# Patient Record
Sex: Female | Born: 1995 | Race: Black or African American | Hispanic: No
Health system: Midwestern US, Community
[De-identification: ages and names within clinical notes are randomized; demographics above are authoritative.]

---
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
## 2018-08-11 NOTE — ED Notes (Signed)
ED Triage Note       ED Secondary Triage Entered On:  08/11/2018 21:07 EDT    Performed On:  08/11/2018 21:06 EDT by Willeen Cass, RN, Rhys Martini               General Information   Barriers to Learning :   None evident   ED Home Meds Section :   Document assessment   [facility] ED Fall Risk Section :   Document assessment   ED Advance Directives Section :   Document assessment   Royston Bake - 08/11/2018 21:06 EDT   (As Of: 08/11/2018 21:07:12 EDT)   Problems(Active)    Asthma (SNOMED CT  :215872761 )  Name of Problem:   Asthma ; Recorder:   Marcellina Millin; Confirmation:   Confirmed ; Classification:   Patient Stated ; Code:   [id number] ; Contributor System:   Dietitian ; Last Updated:   08/11/2018 20:34 EDT ; Life Cycle Date:   08/11/2018 ; Life Cycle Status:   Active ; Vocabulary:   SNOMED CT          Diagnoses(Active)    Shortness of breath  Date:   08/11/2018 ; Diagnosis Type:   Reason For Visit ; Confirmation:   Complaint of ; Clinical Dx:   Shortness of breath ; Classification:   Medical ; Clinical Service:   Emergency medicine ; Code:   PNED ; Probability:   0 ; Diagnosis Code:   [id number]             -    Procedure History   (As Of: 08/11/2018 21:07:12 EDT)     Phoebe Perch Fall Risk Assessment Tool   Hx of falling last 3 months ED Fall :   No   Patient confused or disoriented ED Fall :   No   Patient intoxicated or sedated ED Fall :   No   Patient impaired gait ED Fall :   No   Use a mobility assistance device ED Fall :   No   Patient altered elimination ED Fall :   No   [facility] ED Fall Score :   0    Willeen Cass RN, Evette Georges F - 08/11/2018 21:06 EDT   ED Advance Directive   Advance Directive :   No   Willeen Cass RN, Evette Georges F - 08/11/2018 21:06 EDT

## 2018-08-11 NOTE — ED Notes (Signed)
ED Notes               called talked to pt. informed of corona virus test negative. pt still coughing, no new symptoms. reviewed continued cautious observation at home and safety standards per cdc guidelines.   [employer]

## 2018-08-11 NOTE — Discharge Summary (Signed)
 ED Clinical Summary                     [facility]  [address]  [place], [place] [address]  [phone]          PERSON INFORMATION  Name: MALVA, DIESING Age:  23 Years DOB: Aug 15, 1995   Sex: Female Language: English PCP: PCP,  NONE   Marital Status: Single Phone: [phone] Med Service: MED-Medicine   MRN: [id number] Acct# [id number] Arrival: 08/11/2018 20:30:00   Visit Reason: Shortness of breath; FEVER,TROUBLE BREATHING Acuity: 4 LOS: 000 02:45   Address:    [address] [place] [address]   Diagnosis:    Left lower lobe pneumonia  Medications:          New Medications  Printed Prescriptions  benzonatate (Tessalon Perles 100 mg oral capsule) 1 Tabs Oral (given by mouth) 3 times a day as needed cough for 7 Days. Refills: 0.  Last Dose:____________________  Medications that have not changed  Other Medications  albuterol (albuterol CFC free 90 mcg/inh inhalation aerosol)   Last Dose:____________________  budesonide-formoterol (Symbicort 160 mcg-4.5 mcg/inh inhalation aerosol)   Last Dose:____________________  cetirizine (ZyrTEC) 10 Milligram Oral (given by mouth) every day as needed for allergy symptoms.  Last Dose:____________________  dicyclomine (Bentyl 10 mg oral capsule) 1 Capsules Oral (given by mouth) 4 times a day for 3 Days. Refills: 0.,  THIS MEDICATION IS ASSOCIATED  WITH  AN INCREASED RISK OF FALLS.  Last Dose:____________________      Medications Administered During Visit:                Medication Dose Route   iopamidol 100 mL IV Contrast   azithromycin 500 mg Oral               Allergies      No Known Medication Allergies      Major Tests and Procedures:  The following procedures and tests were performed during your ED visit.  COMMON PROCEDURES%>  COMMON PROCEDURES COMMENTS%>                PROVIDER INFORMATION               Provider Role Assigned [place], [place] [facility] ED MidLevel 08/11/2018 20:31:40    Blair, RN, Siri FALCON ED  Nurse 08/11/2018 20:55:10    Newton Proffer ED Nurse 08/11/2018 21:11:56        Attending Physician:  DALU-MD,  DAVID      Admit Doc  DALU-MD,  DAVID     Consulting Doc       VITALS INFORMATION  Vital Sign Triage Latest   Temp Oral ORAL_1%> ORAL%>   Temp Temporal TEMPORAL_1%> TEMPORAL%>   Temp Intravascular INTRAVASCULAR_1%> INTRAVASCULAR%>   Temp Axillary AXILLARY_1%> AXILLARY%>   Temp Rectal RECTAL_1%> RECTAL%>   02 Sat 96 % 97 %   Respiratory Rate RATE_1%> RATE%>   Peripheral Pulse Rate PULSE RATE_1%>121 bpm PULSE RATE%>   Apical Heart Rate HEART RATE_1%> HEART RATE%>   Blood Pressure BLOOD PRESSURE_1%>/ BLOOD PRESSURE_1%>83 mmHg BLOOD PRESSURE%> / BLOOD PRESSURE%>74 mmHg                 Immunizations      No Immunizations Documented This Visit          DISCHARGE INFORMATION   Discharge Disposition: H Outpt-Sent Home   Discharge Location:  Home   Discharge Date and Time:  08/11/2018 23:15:00   ED Checkout Date and Time:  08/11/2018 23:15:00     DEPART REASON INCOMPLETE INFORMATION               Depart Action Incomplete Reason   Interactive View/I&O Recently assessed               Problems      No Problems Documented              Smoking Status      Never smoker         PATIENT EDUCATION INFORMATION  Instructions:     Community-Acquired Pneumonia, Adult     Follow up:                   With: Address: When:   Follow up with primary care provider  Within 1 week   Comments:   Please follow-up with your primary care provider. If you do not have one, you can call [phone] [facility] and they can help you set up an appointment.   Please quarantine at home until you get the report back on your covid 19 viral testing. You will be called with results.   In the meantime, take full course of antibiotics as prescribed starting tomorrow.   You may take cough medication as prescribed as needed for symptoms.   Continue your home inhalers as needed for wheezing or shortness of breath.   Alternate Tylenol and ibuprofen as needed for fevers.    Return to the ER for any worsening or new concerning symptoms as discussed.              ED PROVIDER DOCUMENTATION     Patient:   KIANI, WURTZEL             MRN: [id number]            FIN: [id number]               Age:   60 years     Sex:  Female     DOB:  Jul 13, 1995   Associated Diagnoses:   Left lower lobe pneumonia   Author:   VINETTA CAMIE HAMMERSMITH      Basic Information   Time seen: Provider Seen (ST)   ED Provider/Time:    VINETTA CAMIE LANE / 08/11/2018 20:31  .   Additional information: Chief Complaint from Nursing Triage Note   Chief Complaint  Chief Complaint: 3 days of feeling sob and lightheaded has hx of asthma (08/11/18 20:32:00).      History of Present Illness   Patient is a 23 year old female who presents to the emergency department for evaluation of multiple complaints.  She states that for the past 3 days, she has had dry cough, shortness of breath, tightness in her chest, intermittent lightheadedness, and fevers.  She reports T-max of 102 earlier today, improved with TheraFlu this evening around 7 PM.  She denies any syncope, nasal congestion, sore throat or ear pain, hemoptysis, abdominal pain, nausea/vomiting, diarrhea, urinary complaints, rashes, lower extremity edema or calf tenderness.  She denies any known ill contacts or recent travel.  She is a non-smoker.  She does have a history of asthma for which she uses an inhaler, and states that she has been using this at home with improvement in wheezing, but continues to have other symptoms as above..        Review of Systems   Constitutional symptoms:  Fever, no chills, no fatigue.  Skin symptoms:  No rash,    Eye symptoms:  No blurred vision,    ENMT symptoms:  No ear pain, no sore throat, no nasal congestion.    Respiratory symptoms:  Shortness of breath, cough, wheezing, no hemoptysis, no sputum production.    Cardiovascular symptoms:  Chest pain, no palpitations, no syncope, no diaphoresis, no peripheral edema.    Gastrointestinal  symptoms:  No abdominal pain, no nausea, no vomiting, no diarrhea.    Genitourinary symptoms:  No dysuria,    Musculoskeletal symptoms:  No back pain,    Neurologic symptoms:  Dizziness, no headache, no altered level of consciousness, no numbness, no tingling, no focal weakness.    Allergy/immunologic symptoms:  No recurrent infections, no impaired immunity.       Health Status   Allergies:    Allergic Reactions (All)  No Known Medication Allergies.   Medications:  (Selected)   Prescriptions  Prescribed  Bentyl 10 mg oral capsule: 10 mg, 1 caps, Oral, QID, for 3 days, 12 caps, 0 Refill(s)  Documented Medications  Documented  Symbicort 160 mcg-4.5 mcg/inh inhalation aerosol: 0 Refill(s)  ZyrTEC: 10 mg, Oral, Daily, PRN: allergy symptoms, 0 Refill(s)  albuterol CFC free 90 mcg/inh inhalation aerosol: 0 Refill(s).      Past Medical/ Family/ Social History   Medical history: Reviewed as documented in chart.   Surgical history: Reviewed as documented in chart.   Family history: Not significant.   Social history: Reviewed as documented in chart.   Problem list:    Active Problems (1)  Asthma   , per nurse's notes.      Physical Examination               Vital Signs   Vital Signs   08/11/2018 20:32 EDT Systolic Blood Pressure 121 mmHg    Diastolic Blood Pressure 83 mmHg    Temperature Oral 37 degC    Heart Rate Monitored 109 bpm  HI    Respiratory Rate 17 br/min    SpO2 96 %    Measurements   08/11/2018 20:35 EDT Body Mass Index est meas 20.28 kg/m2    Body Mass Index Measured 20.28 kg/m2   08/11/2018 20:32 EDT Height/Length Measured 157 cm    Weight Dosing 50 kg    General:  Alert, no acute distress.    Skin:  Warm, dry.    Head:  Normocephalic, atraumatic.    Neck:  Supple, trachea midline, no tenderness.    Eye:  Extraocular movements are intact, normal conjunctiva.    Cardiovascular:  Normal peripheral perfusion, No edema, Regular rhythm, tachycardic.    Respiratory:  Lungs are clear to auscultation, respirations are  non-labored, breath sounds are equal, Symmetrical chest wall expansion.    Chest wall:  No tenderness.   Gastrointestinal:  Soft, Nontender.    Neurological:  Alert and oriented to person, place, time, and situation, No focal neurological deficit observed.    Psychiatric:  Cooperative, appropriate mood & affect, normal judgment.       Medical Decision Making   Differential Diagnosis::  Bronchitis, upper respiratory infection, pneumonia, pulmonary embolism, influenza.    Rationale:  PA/NP reviewed with co-signing physician: ECG, lab results, radiology studies, medication, diagnosis, and plan of care.   Documents reviewed:  Emergency department nurses' notes.   Electrocardiogram:  Emergency Provider interpretation performed by me, No ST-T changes, Sinus tachycardia.    Results review:  Lab results : Lab View   08/11/2018 21:14 EDT Estimated Creatinine  Clearance 116.29 mL/min   08/11/2018 20:59 EDT WBC 7.4 x10e3/mcL    RBC 4.27 x10e6/mcL    Hgb 12.8 g/dL    HCT 61.3 %    MCV 09.5 fL    MCH 30.0 pg    MCHC 33.2 g/dL    RDW 88.1 %    Platelet 224 x10e3/mcL    MPV 9.8 fL    Neutro Auto 57.1 %    Neutro Absolute 4.3 x10e3/mcL    Immature Grans Percent 0.1 %  NA    Immature Grans Absolute 0.0 x10e3/mcL  NA    Lymph Auto 27.0 %    Lymph Absolute 2.0 x10e3/mcL    Mono Auto 15.1 %  HI    Mono Absolute 1.1 x10e3/mcL  HI    Eosinophil Percent 0.4 %    Eos Absolute 0.0 x10e3/mcL    Basophil Auto 0.3 %    Baso Absolute 0.0 x10e3/mcL    D Dimer 0.64 mcg/mL FEU  HI    Sodium Lvl 134 mmol/L  LOW    Potassium Lvl 3.5 mmol/L    Chloride 96 mmol/L  LOW    CO2 25 mmol/L    Glucose Random 112 mg/dL  HI    BUN 11 mg/dL    Creatinine Lvl 0.6 mg/dL    AGAP 13 mmol/L    Osmolality Calc 268 mOsm/kg  LOW    Calcium Lvl 9.1 mg/dL    eGFR AA 848 fO/fpw/8.26f    eGFR Non-AA 130 mL/min/1.26m    Radiology results:  Rad Results (ST)   CT Angiography Chest w/ + w/o Contrast  ?  08/11/18 21:54:43  CTA CHEST: 08/11/18    INDICATION: PE suspected, high  pretest prob    TECHNIQUE: Images were obtained from apices through adrenals with IV contrast  without adverse reaction, with volumetric 3-D imaging also performed. CT  scanning was performed using radiation dose reduction techniques when  appropriate, per system protocol.      COMPARISON: None    FINDINGS: Pulmonary arteries and major branches show no filling defect, and no  pulmonary embolus is appreciated. No sign of aortic aneurysm or aortic  dissection is seen. No enlargement of main pulmonary artery.    Patchy moderate consolidation is present in the medial aspect of the left lower  lobe along the mediastinum particularly. Some air bronchograms are seen as well.  No cavitation. No sign of pulmonary density elsewhere. Trace right pleural fluid  with little if any left pleural fluid appreciated.    No thyroid nodule. 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  Some small nodes could be present in the left hilum. No sign of adenopathy  elsewhere. No cardiomegaly or pericardial effusion.    Upper abdomen is unremarkable. No bone lesion appreciated.      IMPRESSION:    No pulmonary embolus identified.    Moderate patchy consolidation in the left lower lobe most resembling pneumonia.  Depending on clinical information follow-up PA and lateral views of the chest  could be obtained to ensure resolution.  ?  Signed By: SALLY LYNWOOD MICKEY SABRA   Notes:  23 year old female who has had cough, fever, shortness of breath over the past few days.  She is resting comfortably currently, no acute distress.  Hemodynamically stable and nontoxic in appearance.  Her respirations are nonlabored and her lungs are clear to auscultation bilaterally.  Her oxygen saturation is 96% on room air.  She is tachycardic in the 1 teens at initial presentation,  but heart sounds are regular and she has good peripheral perfusion.  Her ENT exam is unremarkable.  She has no lower extremity edema or calf tenderness.  EKG shows  tachycardia without any evidence of ischemia today.  Labs including CBC, BMP, dimer are reviewed.  Her d-dimer is slightly elevated today, and given her chest tightness, shortness of breath, and tachycardia, a CT scan of her chest was obtained to rule out pulmonary embolus, though she does not have any risk factors for this.  Thankfully, no evidence of thrombosis, but she does have evidence of a left lower lobe pneumonia.  She has a normal white blood cell count.  We will start her on antibiotics for presumed community-acquired pneumonia.  Given current covid 19 viral pandemic, I have also obtained nasopharyngeal swab for this and she will be notified of any positive results.  Counseled on quarantine at home in the meantime.  We reviewed signs and symptoms which return to the emergency department, otherwise we will have her follow-up with primary care.  She voices understanding and is stable for discharge.  At the time of discharge, her heart rate is now in the 90s and she continues to have oxygen saturations in the upper 90s with nonlabored respirations.      Impression and Plan   Diagnosis   Left lower lobe pneumonia (ICD10-CM J18.9, Discharge, Medical)   Plan   Condition: Stable.    Disposition: Discharged: to home.    Prescriptions: Launch prescriptions   Pharmacy:  Tessalon Perles 100 mg oral capsule (Prescribe): 1 tabs, Oral, TID, for 7 days, PRN: cough, 21 caps, 0 Refill(s)  Zithromax 250 mg oral tablet (Prescribe): 1,000 mg, 4 tabs, Oral, Once, 4 tabs, 0 Refill(s).    Patient was given the following educational materials: Community-Acquired Pneumonia, Adult.    Follow up with: Follow up with primary care provider Within 1 week Please follow-up with your primary care provider.  If you do not have one, you can call [phone] [facility] and they can help you set up an appointment.  Please quarantine at home until you get the report back on your covid 19 viral testing.  You will be called with results.  In the meantime,  take full course of antibiotics as prescribed starting tomorrow.  You may take cough medication as prescribed as needed for symptoms.  Continue your home inhalers as needed for wheezing or shortness of breath.  Alternate Tylenol and ibuprofen as needed for fevers.  Return to the ER for any worsening or new concerning symptoms as discussed..    Counseled: Patient, Regarding diagnosis, Regarding diagnostic results, Regarding treatment plan, Regarding prescription, Patient indicated understanding of instructions.    Addendum by EUGENE LENIS on Aug 11, 2018 23:06 EDT           The patient's history, exam findings, diagnostics, and a summary of any interventions or procedures was reviewed in detail with PA.

## 2018-08-11 NOTE — ED Notes (Signed)
 ED Patient Summary       ;       [facility]  [address], [place], [place] [address]  [phone]  Discharge Instructions (Patient)  _______________________________________     Name: Caroline Moreno, Caroline Moreno  DOB: [birth year]                   MRN: [id number]                   FIN: [id number]  Reason For Visit: Shortness of breath; FEVER,TROUBLE BREATHING  Final Diagnosis: Left lower lobe pneumonia     Visit Date: 08/11/2018 20:30:00  Address: [address] [place] [address]  Phone: [phone]     Primary Care Provider:      Name: PCP,  NONE      Phone:         Emergency Department Providers:        Primary Physician:            [facility] would like to thank you for allowing us  to assist you with your healthcare needs. The following includes patient education materials and information regarding your injury/illness.     Follow-up Instructions: You were treated today on an emergency basis, it may be wise to contact your primary care provider to notify them of your visit today. You may have been referred to your regular doctor or a specialist, please follow up as instructed. If your condition worsens or you can't get in to see the doctor, contact the Emergency Department.              With: Address: When:   Follow up with primary care provider  Within 1 week   Comments:   Please follow-up with your primary care provider. If you do not have one, you can call [phone] DOCS and they can help you set up an appointment.   Please quarantine at home until you get the report back on your covid 19 viral testing. You will be called with results.   In the meantime, take full course of antibiotics as prescribed starting tomorrow.   You may take cough medication as prescribed as needed for symptoms.   Continue your home inhalers as needed for wheezing or shortness of breath.   Alternate Tylenol and ibuprofen as needed for fevers.   Return to the ER for any worsening or new  concerning symptoms as discussed.              Printed Prescriptions:    Patient Education Materials:  Discharge Orders          Discharge Patient 08/11/18 22:13:00 EDT  Discharge Special Instructions *You will be notified of your test results by phone (positive or negative), but it may take up to 5 days to get these back. Please make sure that we have your correct phone number before you leave the Emergency Department         Comment:      Community-Acquired Pneumonia, Adult     Community-Acquired Pneumonia, Adult    Pneumonia is an infection of the lungs. There are different types of pneumonia. One type can develop while a person is in a hospital. A different type, called community-acquired pneumonia, develops in people who are not, or have not recently been, in the hospital or other health care facility.       CAUSES    Pneumonia may be caused by bacteria, viruses,  or funguses. Community-acquired pneumonia is often caused by Streptococcus pneumonia bacteria. These bacteria are often passed from one person to another by breathing in droplets from the cough or sneeze of an infected person.    RISK FACTORS    The condition is more likely to develop in:     People who have?chronic diseases, such as chronic obstructive pulmonary disease (COPD), asthma, congestive heart failure, cystic fibrosis, diabetes, or kidney disease.     People who have?early-stage or late-stage HIV.     People who have?sickle cell disease.     People who have?had their spleen removed (splenectomy).     People who have?poor dental hygiene.     People who have?medical conditions that increase the risk of breathing in (aspirating) secretions their own mouth and nose. ?     People who have?a weakened immune system (immunocompromised).     People who smoke.     People who?travel to areas where pneumonia-causing germs commonly exist.     People who?are around animal habitats or animals that have pneumonia-causing germs, including birds, bats,  rabbits, cats, and farm animals.    SYMPTOMS    Symptoms of this condition include:     A?dry cough.     A wet (productive) cough.     Fever.     Sweating.     Chest pain, especially when breathing deeply or coughing.     Rapid breathing or difficulty breathing.     Shortness of breath.     Shaking chills.     Fatigue.     Muscle aches.    DIAGNOSIS    Your health care provider will take a medical history and perform a physical exam. You may also have other tests, including:     Imaging studies of your chest, including X-rays.     Tests to check your blood oxygen level and other blood gases.     Other tests on blood, mucus (sputum), fluid around your lungs (pleural fluid), and urine.    If your pneumonia is severe, other tests may be done to identify the specific cause of your illness.    TREATMENT    The type of treatment that you receive depends on many factors, such as the cause of your pneumonia, the medicines you take, and other medical conditions that you have. For most adults, treatment and recovery from pneumonia may occur at home. In some cases, treatment must happen in a hospital. Treatment may include:     Antibiotic medicines, if the pneumonia was caused by bacteria.     Antiviral medicines, if the pneumonia was caused by a virus.     Medicines that are given by mouth or through an IV tube.     Oxygen.     Respiratory therapy.    Although rare, treating severe pneumonia may include:     Mechanical ventilation. This is done if you are not breathing well on your own and you cannot maintain a safe blood oxygen level.     Thoracentesis. This procedure?removes fluid around one lung or both lungs to help you breathe better.    HOME CARE INSTRUCTIONS     Take over-the-counter and prescription medicines only as told by your health care provider.    ? Only take?cough medicine if you are losing sleep. Understand that cough medicine can prevent your body's natural ability to remove mucus from your lungs.    ? If  you were prescribed an antibiotic medicine, take it  as told by your health care provider. Do not stop taking the antibiotic even if you start to feel better.     Sleep in a semi-upright position at night. Try sleeping in a reclining chair, or place a few pillows under your head.     Do not use tobacco products, including cigarettes, chewing tobacco, and e-cigarettes. If you need help quitting, ask your health care provider.     Drink enough water to keep your urine clear or pale yellow. This will help to thin out mucus secretions in your lungs.    PREVENTION    There are ways that you can decrease your risk of developing community-acquired pneumonia. Consider getting a pneumococcal vaccine if:     You are older than 21 years of age.     You are older than 21 years of age and are undergoing cancer treatment, have chronic lung disease, or have other medical conditions that affect your immune system. Ask your health care provider if this applies to you.    There are different types and schedules of pneumococcal vaccines. Ask your health care provider which vaccination option is best for you.    You may also prevent community-acquired pneumonia if you take these actions:     Get an influenza vaccine every year. Ask your health care provider which type of influenza vaccine is best for you.     Go to the dentist on a regular basis.     Wash your hands often. Use hand sanitizer if soap and water are not available.    SEEK MEDICAL CARE IF:     You have a fever.     You are losing sleep because you cannot control your cough with cough medicine.    SEEK IMMEDIATE MEDICAL CARE IF:     You have worsening shortness of breath.     You have increased chest pain.     Your sickness becomes worse, especially if you are an older adult or have a weakened immune system.     You cough up blood.    This information is not intended to replace advice given to you by your health care provider. Make sure you discuss any questions you have  with your health care provider.    Document Released: 03/25/2005 Document Revised: 12/14/2014 Document Reviewed: 07/20/2014  Elsevier Interactive Patient Education ?2016 Elsevier Inc[employer]         Allergy Info: No Known Medication Allergies     Medication Information:  [facility] provided you with a complete list of medications post discharge, if you have been instructed to stop taking a medication please ensure you also follow up with this information to your Primary Care Physician.  Unless otherwise noted, patient will continue to take medications as prescribed prior to the Emergency Room visit.  Any specific questions regarding your chronic medications and dosages should be discussed with your physician(s) and pharmacist.          albuterol (albuterol CFC free 90 mcg/inh inhalation aerosol)  benzonatate (Tessalon Perles 100 mg oral capsule) 1 Tabs Oral (given by mouth) 3 times a day as needed cough for 7 Days. Refills: 0.  budesonide-formoterol (Symbicort 160 mcg-4.5 mcg/inh inhalation aerosol)  cetirizine (ZyrTEC) 10 Milligram Oral (given by mouth) every day as needed for allergy symptoms.  dicyclomine (Bentyl 10 mg oral capsule) 1 Capsules Oral (given by mouth) 4 times a day for 3 Days. Refills: 0.,  THIS MEDICATION IS ASSOCIATED  WITH  AN INCREASED RISK OF FALLS.      Medications Administered During Visit:              Medication Dose Route   iopamidol 100 mL IV Contrast   azithromycin 500 mg Oral          Major Tests and Procedures:  The following procedures and tests were performed during your Emergency Room visit.  COMMON PROCEDURES%>  COMMON PROCEDURES COMMENTS%>          Laboratory Orders  Name Status Details   .CoVID-19, NAA (R) Ordered Nasopharyngeal Swab, Stat, ST - Stat, 08/11/18 20:44:00 EDT, 08/11/18 20:44:00 EDT, Nurse collect, KIMSEY-PA-C,  [address], Print label Y/N   BMP Completed Blood, Stat, ST - Stat, 08/11/18 20:42:00 EDT, 08/11/18 20:42:00 EDT, Nurse collect,  KIMSEY-PA-C,  [address], Print label Y/N   CBCDIFF Completed Blood, Stat, ST - Stat, 08/11/18 20:42:00 EDT, 08/11/18 20:42:00 EDT, Nurse collect, VINETTA,  [address], Print label Y/N   D Dimer Completed Blood, Stat, ST - Stat, 08/11/18 20:42:00 EDT, 08/11/18 20:42:00 EDT, Nurse collect, VINETTA,  [address], Print label Y/N               Radiology Orders  Name Status Details   CT Angiography Chest w/ + w/o Contrast Completed 08/11/18 21:17:00 EDT, STAT 1 hour or less, Reason: PE suspected, high pretest prob, Transport Mode: STRETCHER, Rad Type, pp_set_radiology_subspecialty               Patient Care Orders  Name Status Details   COVID-19 Status Ordered 08/11/18 20:44:21 EDT, NOT VALID FOR pharmacy, laboratory, radiology., 08/11/18 20:44:21 EDT, COVID-19 PUI - under investigation   Communication to Nursing Ordered 08/11/18 20:44:00 EDT, NOT VALID FOR pharmacy, laboratory, radiology., 08/11/18 20:44:00 EDT, 08/11/18 20:44:00 EDT   Discharge Patient Ordered 08/11/18 22:13:00 EDT   Discharge Special Instructions Ordered *You will be notified of your test results by phone (positive or negative), but it may take up to 5 days to get these back. Please make sure that we have your correct phone number before you leave the Emergency Department   ED Assessment Adult Completed 08/11/18 20:35:54 EDT, 08/11/18 20:35:54 EDT   ED Secondary Triage Completed 08/11/18 20:35:54 EDT, 08/11/18 20:35:54 EDT   ED Triage Adult Completed 08/11/18 20:30:07 EDT, 08/11/18 20:30:07 EDT   Notify Provider Ordered 08/11/18 20:44:21 EDT, This message can only be seen by Nursing, it is not visible to Pharmacy, Laboratory, or Radiology., 08/11/18 20:44:21 EDT   Patient Isolation Ordered 08/11/18 20:44:00 EDT, Contact and Airborne, Constant Indicator   Saline Lock Insert Completed 08/11/18 21:03:00 EDT, Once, 08/11/18 21:03:00 EDT        ---------------------------------------------------------------------------------------------------------------------  [facility] allows you to manage your health, view your test results, and retrieve your discharge documents from your hospital stay securely and conveniently from your computer.     To begin the enrollment process, visit [email or web address] Click on "Sign up now" under [facility].   Comment:

## 2018-08-11 NOTE — ED Provider Notes (Signed)
Cough *ED        Patient:   Caroline Moreno, Caroline Moreno             MRN: [id number]            FIN: [id number]               Age:   23 years     Sex:  Female     DOB:  [birth year]   Associated Diagnoses:   Left lower lobe pneumonia   Author:   Jacky Kindle      Basic Information   Time seen: Provider Seen (ST)   ED Provider/Time:    Hanley Hays LANE / 08/11/2018 20:31  .   Additional information: Chief Complaint from Nursing Triage Note   Chief Complaint  Chief Complaint: 3 days of feeling sob and lightheaded has hx of asthma (08/11/18 20:32:00).      History of Present Illness   Patient is a 23 year old female who presents to the emergency department for evaluation of multiple complaints.  She states that for the past 3 days, she has had dry cough, shortness of breath, tightness in her chest, intermittent lightheadedness, and fevers.  She reports T-max of 102 earlier today, improved with TheraFlu this evening around 7 PM.  She denies any syncope, nasal congestion, sore throat or ear pain, hemoptysis, abdominal pain, nausea/vomiting, diarrhea, urinary complaints, rashes, lower extremity edema or calf tenderness.  She denies any known ill contacts or recent travel.  She is a non-smoker.  She does have a history of asthma for which she uses an inhaler, and states that she has been using this at home with improvement in wheezing, but continues to have other symptoms as above..        Review of Systems   Constitutional symptoms:  Fever, no chills, no fatigue.    Skin symptoms:  No rash,    Eye symptoms:  No blurred vision,    ENMT symptoms:  No ear pain, no sore throat, no nasal congestion.    Respiratory symptoms:  Shortness of breath, cough, wheezing, no hemoptysis, no sputum production.    Cardiovascular symptoms:  Chest pain, no palpitations, no syncope, no diaphoresis, no peripheral edema.    Gastrointestinal symptoms:  No abdominal pain, no nausea, no vomiting, no diarrhea.    Genitourinary symptoms:  No dysuria,     Musculoskeletal symptoms:  No back pain,    Neurologic symptoms:  Dizziness, no headache, no altered level of consciousness, no numbness, no tingling, no focal weakness.    Allergy/immunologic symptoms:  No recurrent infections, no impaired immunity.       Health Status   Allergies:    Allergic Reactions (All)  No Known Medication Allergies.   Medications:  (Selected)   Prescriptions  Prescribed  Bentyl 10 mg oral capsule: 10 mg, 1 caps, Oral, QID, for 3 days, 12 caps, 0 Refill(s)  Documented Medications  Documented  Symbicort 160 mcg-4.5 mcg/inh inhalation aerosol: 0 Refill(s)  ZyrTEC: 10 mg, Oral, Daily, PRN: allergy symptoms, 0 Refill(s)  albuterol CFC free 90 mcg/inh inhalation aerosol: 0 Refill(s).      Past Medical/ Family/ Social History   Medical history: Reviewed as documented in chart.   Surgical history: Reviewed as documented in chart.   Family history: Not significant.   Social history: Reviewed as documented in chart.   Problem list:    Active Problems (1)  Asthma   , per nurse's notes.  Physical Examination               Vital Signs   Vital Signs   08/11/2018 20:32 EDT Systolic Blood Pressure 121 mmHg    Diastolic Blood Pressure 83 mmHg    Temperature Oral 37 degC    Heart Rate Monitored 109 bpm  HI    Respiratory Rate 17 br/min    SpO2 96 %    Measurements   08/11/2018 20:35 EDT Body Mass Index est meas 20.28 kg/m2    Body Mass Index Measured 20.28 kg/m2   08/11/2018 20:32 EDT Height/Length Measured 157 cm    Weight Dosing 50 kg    General:  Alert, no acute distress.    Skin:  Warm, dry.    Head:  Normocephalic, atraumatic.    Neck:  Supple, trachea midline, no tenderness.    Eye:  Extraocular movements are intact, normal conjunctiva.    Cardiovascular:  Normal peripheral perfusion, No edema, Regular rhythm, tachycardic.    Respiratory:  Lungs are clear to auscultation, respirations are non-labored, breath sounds are equal, Symmetrical chest wall expansion.    Chest wall:  No tenderness.    Gastrointestinal:  Soft, Nontender.    Neurological:  Alert and oriented to person, place, time, and situation, No focal neurological deficit observed.    Psychiatric:  Cooperative, appropriate mood & affect, normal judgment.       Medical Decision Making   Differential Diagnosis::  Bronchitis, upper respiratory infection, pneumonia, pulmonary embolism, influenza.    Rationale:  PA/NP reviewed with co-signing physician: ECG, lab results, radiology studies, medication, diagnosis, and plan of care.   Documents reviewed:  Emergency department nurses' notes.   Electrocardiogram:  Emergency Provider interpretation performed by me, No ST-T changes, Sinus tachycardia.    Results review:  Lab results : Lab View   08/11/2018 21:14 EDT Estimated Creatinine Clearance 116.29 mL/min   08/11/2018 20:59 EDT WBC 7.4 x10e3/mcL    RBC 4.27 x10e6/mcL    Hgb 12.8 g/dL    HCT 29.0 %    MCV 47.5 fL    MCH 30.0 pg    MCHC 33.2 g/dL    RDW 33.9 %    Platelet 224 x10e3/mcL    MPV 9.8 fL    Neutro Auto 57.1 %    Neutro Absolute 4.3 x10e3/mcL    Immature Grans Percent 0.1 %  NA    Immature Grans Absolute 0.0 x10e3/mcL  NA    Lymph Auto 27.0 %    Lymph Absolute 2.0 x10e3/mcL    Mono Auto 15.1 %  HI    Mono Absolute 1.1 x10e3/mcL  HI    Eosinophil Percent 0.4 %    Eos Absolute 0.0 x10e3/mcL    Basophil Auto 0.3 %    Baso Absolute 0.0 x10e3/mcL    D Dimer 0.64 mcg/mL FEU  HI    Sodium Lvl 134 mmol/L  LOW    Potassium Lvl 3.5 mmol/L    Chloride 96 mmol/L  LOW    CO2 25 mmol/L    Glucose Random 112 mg/dL  HI    BUN 11 mg/dL    Creatinine Lvl 0.6 mg/dL    AGAP 13 mmol/L    Osmolality Calc 268 mOsm/kg  LOW    Calcium Lvl 9.1 mg/dL    eGFR AA 179 EB/BWN/7.54W????    eGFR Non-AA 130 mL/min/1.38m????    Radiology results:  Rad Results (ST)   CT Angiography Chest w/ + w/o Contrast  ?  08/11/18 21:54:43  CTA CHEST: 08/11/18    INDICATION: PE suspected, high pretest prob    TECHNIQUE: Images were obtained from apices through adrenals with IV contrast  without  adverse reaction, with volumetric 3-D imaging also performed. CT  scanning was performed using radiation dose reduction techniques when  appropriate, per system protocol.      COMPARISON: None    FINDINGS: Pulmonary arteries and major branches show no filling defect, and no  pulmonary embolus is appreciated. No sign of aortic aneurysm or aortic  dissection is seen. No enlargement of main pulmonary artery.    Patchy moderate consolidation is present in the medial aspect of the left lower  lobe along the mediastinum particularly. Some air bronchograms are seen as well.  No cavitation. No sign of pulmonary density elsewhere. Trace right pleural fluid  with little if any left pleural fluid appreciated.    No thyroid nodule. 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  Some small nodes could be present in the left hilum. No sign of adenopathy  elsewhere. No cardiomegaly or pericardial effusion.    Upper abdomen is unremarkable. No bone lesion appreciated.      IMPRESSION:    No pulmonary embolus identified.    Moderate patchy consolidation in the left lower lobe most resembling pneumonia.  Depending on clinical information follow-up PA and lateral views of the chest  could be obtained to ensure resolution.  ?  Signed By: Brandon Melnick   Notes:  23 year old female who has had cough, fever, shortness of breath over the past few days.  She is resting comfortably currently, no acute distress.  Hemodynamically stable and nontoxic in appearance.  Her respirations are nonlabored and her lungs are clear to auscultation bilaterally.  Her oxygen saturation is 96% on room air.  She is tachycardic in the 1 teens at initial presentation, but heart sounds are regular and she has good peripheral perfusion.  Her ENT exam is unremarkable.  She has no lower extremity edema or calf tenderness.  EKG shows tachycardia without any evidence of ischemia today.  Labs including CBC, BMP, dimer are reviewed.  Her d-dimer  is slightly elevated today, and given her chest tightness, shortness of breath, and tachycardia, a CT scan of her chest was obtained to rule out pulmonary embolus, though she does not have any risk factors for this.  Thankfully, no evidence of thrombosis, but she does have evidence of a left lower lobe pneumonia.  She has a normal white blood cell count.  We will start her on antibiotics for presumed community-acquired pneumonia.  Given current covid 19 viral pandemic, I have also obtained nasopharyngeal swab for this and she will be notified of any positive results.  Counseled on quarantine at home in the meantime.  We reviewed signs and symptoms which return to the emergency department, otherwise we will have her follow-up with primary care.  She voices understanding and is stable for discharge.  At the time of discharge, her heart rate is now in the 90s and she continues to have oxygen saturations in the upper 90s with nonlabored respirations.      Impression and Plan   Diagnosis   Left lower lobe pneumonia (ICD10-CM J18.9, Discharge, Medical)   Plan   Condition: Stable.    Disposition: Discharged: to home.    Prescriptions: Launch prescriptions   Pharmacy:  Tessalon Perles 100 mg oral capsule (Prescribe): 1 tabs, Oral, TID, for 7 days, PRN: cough, 21 caps, 0 Refill(s)  Zithromax  250 mg oral tablet (Prescribe): 1,000 mg, 4 tabs, Oral, Once, 4 tabs, 0 Refill(s).    Patient was given the following educational materials: Community-Acquired Pneumonia, Adult.    Follow up with: Follow up with primary care provider Within 1 week Please follow-up with your primary care provider.  If you do not have one, you can call [phone] DOCS and they can help you set up an appointment.  Please quarantine at home until you get the report back on your covid 19 viral testing.  You will be called with results.  In the meantime, take full course of antibiotics as prescribed starting tomorrow.  You may take cough medication as prescribed  as needed for symptoms.  Continue your home inhalers as needed for wheezing or shortness of breath.  Alternate Tylenol and ibuprofen as needed for fevers.  Return to the ER for any worsening or new concerning symptoms as discussed..    Counseled: Patient, Regarding diagnosis, Regarding diagnostic results, Regarding treatment plan, Regarding prescription, Patient indicated understanding of instructions.    Librarian, academic Signed on 08/11/2018 10:44 PM EDT   ________________________________________________   Roxanna Mew LANE-PA-C, PA-C               Modified by: Roxanna Mew LANE-PA-C, PA-C on 08/11/2018 10:08 PM EDT      Modified by: Roxanna Mew LANE-PA-C, PA-C on 08/11/2018 10:44 PM EDTAddendum by DALU-MD,  DAVID on Aug 11, 2018 23:06 EDT               The patient???s history, exam findings, diagnostics, and a summary of any interventions or procedures was reviewed in detail with PA.  Signature Line     Electronically Signed on 08/11/2018 11:06 PM EDT   ________________________________________________   DALU-MD,  DAVID               Modified by: DALU-MD,  DAVID on 08/11/2018 11:06 PM EDT

## 2018-08-11 NOTE — ED Notes (Signed)
ED Note-Nursing       ED RN Reassessment Entered On:  08/11/2018 23:38 EDT    Performed On:  08/11/2018 23:38 EDT by Marcellina Millin               ED RN Reassessment   ED RN Progress Note :   reviewed d/c instructions including quarantine until covid test comes back and updated phone number for pt   Marcellina Millin - 08/11/2018 23:38 EDT

## 2018-08-11 NOTE — ED Notes (Signed)
ED Patient Education Note     Patient Education Materials Follows:  Home Health Care     Community-Acquired Pneumonia, Adult    Pneumonia is an infection of the lungs. There are different types of pneumonia. One type can develop while a person is in a hospital. A different type, called community-acquired pneumonia, develops in people who are not, or have not recently been, in the hospital or other health care facility.       CAUSES    Pneumonia may be caused by bacteria, viruses, or funguses. Community-acquired pneumonia is often caused by Streptococcus pneumonia bacteria. These bacteria are often passed from one person to another by breathing in droplets from the cough or sneeze of an infected person.    RISK FACTORS    The condition is more likely to develop in:     People who have?chronic diseases, such as chronic obstructive pulmonary disease (COPD), asthma, congestive heart failure, cystic fibrosis, diabetes, or kidney disease.     People who have?early-stage or late-stage HIV.     People who have?sickle cell disease.     People who have?had their spleen removed (splenectomy).     People who have?poor dental hygiene.     People who have?medical conditions that increase the risk of breathing in (aspirating) secretions their own mouth and nose. ?     People who have?a weakened immune system (immunocompromised).     People who smoke.     People who?travel to areas where pneumonia-causing germs commonly exist.     People who?are around animal habitats or animals that have pneumonia-causing germs, including birds, bats, rabbits, cats, and farm animals.    SYMPTOMS    Symptoms of this condition include:     A?dry cough.     A wet (productive) cough.     Fever.     Sweating.     Chest pain, especially when breathing deeply or coughing.     Rapid breathing or difficulty breathing.     Shortness of breath.     Shaking chills.     Fatigue.     Muscle aches.    DIAGNOSIS    Your health care provider will take a medical  history and perform a physical exam. You may also have other tests, including:     Imaging studies of your chest, including X-rays.     Tests to check your blood oxygen level and other blood gases.     Other tests on blood, mucus (sputum), fluid around your lungs (pleural fluid), and urine.    If your pneumonia is severe, other tests may be done to identify the specific cause of your illness.    TREATMENT    The type of treatment that you receive depends on many factors, such as the cause of your pneumonia, the medicines you take, and other medical conditions that you have. For most adults, treatment and recovery from pneumonia may occur at home. In some cases, treatment must happen in a hospital. Treatment may include:     Antibiotic medicines, if the pneumonia was caused by bacteria.     Antiviral medicines, if the pneumonia was caused by a virus.     Medicines that are given by mouth or through an IV tube.     Oxygen.     Respiratory therapy.    Although rare, treating severe pneumonia may include:     Mechanical ventilation. This is done if you are not breathing well on your own and   you cannot maintain a safe blood oxygen level.     Thoracentesis. This procedure?removes fluid around one lung or both lungs to help you breathe better.    HOME CARE INSTRUCTIONS     Take over-the-counter and prescription medicines only as told by your health care provider.    ? Only take?cough medicine if you are losing sleep. Understand that cough medicine can prevent your body's natural ability to remove mucus from your lungs.    ? If you were prescribed an antibiotic medicine, take it as told by your health care provider. Do not stop taking the antibiotic even if you start to feel better.     Sleep in a semi-upright position at night. Try sleeping in a reclining chair, or place a few pillows under your head.     Do not use tobacco products, including cigarettes, chewing tobacco, and e-cigarettes. If you need help quitting, ask  your health care provider.     Drink enough water to keep your urine clear or pale yellow. This will help to thin out mucus secretions in your lungs.    PREVENTION    There are ways that you can decrease your risk of developing community-acquired pneumonia. Consider getting a pneumococcal vaccine if:     You are older than 21 years of age.     You are older than 21 years of age and are undergoing cancer treatment, have chronic lung disease, or have other medical conditions that affect your immune system. Ask your health care provider if this applies to you.    There are different types and schedules of pneumococcal vaccines. Ask your health care provider which vaccination option is best for you.    You may also prevent community-acquired pneumonia if you take these actions:     Get an influenza vaccine every year. Ask your health care provider which type of influenza vaccine is best for you.     Go to the dentist on a regular basis.     Wash your hands often. Use hand sanitizer if soap and water are not available.    SEEK MEDICAL CARE IF:     You have a fever.     You are losing sleep because you cannot control your cough with cough medicine.    SEEK IMMEDIATE MEDICAL CARE IF:     You have worsening shortness of breath.     You have increased chest pain.     Your sickness becomes worse, especially if you are an older adult or have a weakened immune system.     You cough up blood.    This information is not intended to replace advice given to you by your health care provider. Make sure you discuss any questions you have with your health care provider.    Document Released: 03/25/2005 Document Revised: 12/14/2014 Document Reviewed: 07/20/2014  Elsevier Interactive Patient Education ?2016 [employer]

## 2018-08-11 NOTE — ED Notes (Signed)
 ED Triage Note       ED Triage Adult Entered On:  08/11/2018 20:35 EDT    Performed On:  08/11/2018 20:32 EDT by Newton Proffer               Triage   Chief Complaint :   3 days of feeling sob and lightheaded has hx of asthma   Numeric Rating Pain Scale :   0 = No pain   [place] Mode of Arrival :   Walking   Infectious Disease Documentation :   Document assessment   Temperature Oral :   37 degC(Converted to: 98.6 degF)    Heart Rate Monitored :   109 bpm (HI)    Respiratory Rate :   17 br/min   Systolic Blood Pressure :   121 mmHg   Diastolic Blood Pressure :   83 mmHg   SpO2 :   96 %   Patient presentation :   HR > 100   Chief Complaint or Presentation suggest infection :   No   Dosing Weight Obtained By :   Patient stated   Weight Dosing :   50 kg(Converted to: 110 lb 4 oz)    Height :   157 cm(Converted to: 5 ft 2 in)    Body Mass Index Dosing :   20 kg/m2   Newton Proffer - 08/11/2018 20:32 EDT   DCP GENERIC CODE   Tracking Acuity :   4   Tracking Group :   ED [employer]   Newton Proffer - 08/11/2018 20:32 EDT   ED General Section :   Document assessment   Pregnancy Status :   Patient denies   Last Menstrual Period :   07/22/2018 EDT   ED Allergies Section :   Document assessment   ED Reason for Visit Section :   Document assessment   ED Home Meds Section :   Document assessment   ED Quick Assessment :   Patient appears awake, alert, oriented to baseline. Skin warm and dry. Moves all extremities. Respiration even and unlabored. Appears in no apparent distress.   Newton Proffer - 08/11/2018 20:32 EDT   ID Risk Screen Symptoms   Recent Travel History :   No recent travel   Close Contact with COVID-19  ID :   No   Have you been tested for COVID-19 ID :   No   Newton Proffer - 08/11/2018 20:32 EDT   Allergies   (As Of: 08/11/2018 20:35:52 EDT)   Allergies (Active)   No Known Medication Allergies  Estimated Onset Date:   Unspecified ; Created ByBETHA Ground, RN, Meri; Reaction Status:   Active ;  Category:   Drug ; Substance:   No Known Medication Allergies ; Type:   Allergy ; Updated By:   Ground OBIE Lais; Reviewed Date:   08/11/2018 20:33 EDT        Psycho-Social   Last 3 mo, thoughts killing self/others :   Patient denies   Newton Proffer - 08/11/2018 20:32 EDT   ED Home Med List   Medication List   (As Of: 08/11/2018 20:35:52 EDT)   Prescription/Discharge Order    dicyclomine  :   dicyclomine ; Status:   Prescribed ; Ordered As Mnemonic:   Bentyl 10 mg oral capsule ; Simple Display Line:   10 mg, 1 caps, Oral, QID, for 3 days, 12 caps, 0 Refill(s) ; Ordering Provider:  HOSKINS-MD,  MATTHEW C; Catalog Code:   dicyclomine ; Order Dt/Tm:   07/20/2015 99:93:75 EDT ; Comment:    THIS MEDICATION IS ASSOCIATED   WITH   AN INCREASED RISK OF FALLS.            Home Meds    albuterol  :   albuterol ; Status:   Documented ; Ordered As Mnemonic:   albuterol CFC free 90 mcg/inh inhalation aerosol ; Simple Display Line:   0 Refill(s) ; Ordering Provider:   ELSON REDELL SAVANT; Catalog Code:   albuterol ; Order Dt/Tm:   08/11/2018 20:35:12 EDT          budesonide-formoterol  :   budesonide-formoterol ; Status:   Documented ; Ordered As Mnemonic:   Symbicort 160 mcg-4.5 mcg/inh inhalation aerosol ; Simple Display Line:   0 Refill(s) ; Ordering Provider:   ELSON REDELL SAVANT; Catalog Code:   budesonide-formoterol ; Order Dt/Tm:   08/11/2018 20:35:12 EDT          cetirizine  :   cetirizine ; Status:   Documented ; Ordered As Mnemonic:   ZyrTEC ; Simple Display Line:   10 mg, Oral, Daily, PRN: allergy symptoms, 0 Refill(s) ; Catalog Code:   cetirizine ; Order Dt/Tm:   07/19/2015 23:34:57 EDT            ED Reason for Visit   (As Of: 08/11/2018 20:35:52 EDT)   Problems(Active)    Asthma (SNOMED CT  :[id number] )  Name of Problem:   Asthma ; Recorder:   Newton Proffer; Confirmation:   Confirmed ; Classification:   Patient Stated ; Code:   [id number] ; Contributor System:   PowerChart ; Last Updated:   08/11/2018  20:34 EDT ; Life Cycle Date:   08/11/2018 ; Life Cycle Status:   Active ; Vocabulary:   SNOMED CT          Diagnoses(Active)    Shortness of breath  Date:   08/11/2018 ; Diagnosis Type:   Reason For Visit ; Confirmation:   Complaint of ; Clinical Dx:   Shortness of breath ; Classification:   Medical ; Clinical Service:   Emergency medicine ; Code:   PNED ; Probability:   0 ; Diagnosis Code:   [id number]

## 2018-08-12 LAB — CBC WITH AUTO DIFFERENTIAL
Absolute Baso #: 0 10*3/uL (ref 0.0–0.2)
Absolute Eos #: 0 10*3/uL (ref 0.0–0.5)
Absolute Lymph #: 2 10*3/uL (ref 1.0–3.2)
Absolute Mono #: 1.1 10*3/uL — ABNORMAL HIGH (ref 0.3–1.0)
Basophils %: 0.3 % (ref 0.0–2.0)
Eosinophils %: 0.4 % (ref 0.0–7.0)
Hematocrit: 38.6 % (ref 38.0–47.0)
Hemoglobin: 12.8 g/dL (ref 11.5–15.7)
Immature Grans (Abs): 0 10*3/uL
Immature Granulocytes: 0.1 %
Lymphocytes: 27 % (ref 15.0–45.0)
MCH: 30 pg (ref 27.0–34.5)
MCHC: 33.2 g/dL (ref 32.0–36.0)
MCV: 90.4 fL (ref 81.0–99.0)
MPV: 9.8 fL (ref 7.2–13.2)
Monocytes: 15.1 % — ABNORMAL HIGH (ref 4.0–12.0)
Neutrophils %: 57.1 % (ref 42.0–74.0)
Neutrophils Absolute: 4.3 10*3/uL (ref 1.6–7.3)
Platelets: 224 10*3/uL (ref 140–440)
RBC: 4.27 x10e6/mcL (ref 3.60–5.20)
RDW: 11.8 % (ref 11.0–16.0)
WBC: 7.4 10*3/uL (ref 3.8–10.6)

## 2018-08-12 LAB — BASIC METABOLIC PANEL
Anion Gap: 13 mmol/L (ref 2–17)
BUN: 11 mg/dL (ref 6–20)
CO2: 25 mmol/L (ref 22–29)
Calcium: 9.1 mg/dL (ref 8.6–10.0)
Chloride: 96 mmol/L — ABNORMAL LOW (ref 98–107)
Creatinine: 0.6 mg/dL (ref 0.5–0.9)
GFR African American: 151 mL/min/{1.73_m2} (ref >=90)
GFR Non-African American: 130 mL/min/{1.73_m2} (ref >=90)
Glucose: 112 mg/dL — ABNORMAL HIGH (ref 70–99)
OSMOLALITY CALCULATED: 268 mOsm/kg — ABNORMAL LOW (ref 270–287)
Potassium: 3.5 mmol/L (ref 3.5–5.3)
Sodium: 134 mmol/L — ABNORMAL LOW (ref 135–145)

## 2018-08-12 LAB — D-DIMER, QUANTITATIVE: D-Dimer, Quant: 0.64 mcg/mL FEU — ABNORMAL HIGH (ref 0.19–0.51)

## 2018-08-14 LAB — COVID-19: CORONAVIRUS (COVID-19), NAA: NOT DETECTED

## 2019-08-06 ENCOUNTER — Inpatient Hospital Stay (HOSPITAL_COMMUNITY): Admission: RE | Admit: 2019-08-06 | Payer: Self-pay | Source: Ambulatory Visit

## 2019-08-10 ENCOUNTER — Encounter (HOSPITAL_COMMUNITY): Payer: Self-pay

## 2019-08-10 DIAGNOSIS — R0602 Shortness of breath: Secondary | ICD-10-CM

## 2019-09-07 ENCOUNTER — Other Ambulatory Visit (HOSPITAL_COMMUNITY)
Admission: RE | Admit: 2019-09-07 | Discharge: 2019-09-07 | Disposition: A | Discharge status: Home / Self Care | Payer: BC Managed Care – PPO | Source: Ambulatory Visit | Attending: Allergy and Immunology | Admitting: Allergy and Immunology

## 2019-09-07 DIAGNOSIS — Z01812 Encounter for preprocedural laboratory examination: Secondary | ICD-10-CM | POA: Diagnosis present

## 2019-09-07 DIAGNOSIS — Z20822 Contact with and (suspected) exposure to covid-19: Secondary | ICD-10-CM | POA: Insufficient documentation

## 2019-09-08 LAB — SARS CORONAVIRUS 2 (TAT 6-24 HRS): SARS Coronavirus 2: NEGATIVE

## 2019-09-09 ENCOUNTER — Other Ambulatory Visit: Payer: Self-pay

## 2019-09-09 ENCOUNTER — Ambulatory Visit (HOSPITAL_COMMUNITY): Payer: BC Managed Care – PPO | Attending: Allergy and Immunology

## 2019-09-09 DIAGNOSIS — R0602 Shortness of breath: Secondary | ICD-10-CM | POA: Insufficient documentation

## 2020-02-03 ENCOUNTER — Emergency Department (HOSPITAL_COMMUNITY)
Admission: EM | Admit: 2020-02-03 | Discharge: 2020-02-03 | Disposition: A | Discharge status: Home / Self Care | Payer: BC Managed Care – PPO | Attending: Emergency Medicine | Admitting: Emergency Medicine

## 2020-02-03 ENCOUNTER — Emergency Department (HOSPITAL_COMMUNITY): Payer: BC Managed Care – PPO

## 2020-02-03 ENCOUNTER — Emergency Department (HOSPITAL_BASED_OUTPATIENT_CLINIC_OR_DEPARTMENT_OTHER): Payer: BC Managed Care – PPO

## 2020-02-03 ENCOUNTER — Encounter (HOSPITAL_COMMUNITY): Payer: Self-pay

## 2020-02-03 ENCOUNTER — Other Ambulatory Visit: Payer: Self-pay

## 2020-02-03 DIAGNOSIS — M79671 Pain in right foot: Secondary | ICD-10-CM | POA: Insufficient documentation

## 2020-02-03 DIAGNOSIS — R079 Chest pain, unspecified: Secondary | ICD-10-CM | POA: Diagnosis not present

## 2020-02-03 DIAGNOSIS — M79609 Pain in unspecified limb: Secondary | ICD-10-CM | POA: Diagnosis not present

## 2020-02-03 DIAGNOSIS — M79672 Pain in left foot: Secondary | ICD-10-CM

## 2020-02-03 LAB — PREGNANCY, URINE: Preg Test, Ur: NEGATIVE

## 2020-02-03 NOTE — Progress Notes (Signed)
Lower extremity venous has been completed.   Preliminary results in CV Proc.   Blanch Media 02/03/2020 7:09 PM

## 2020-02-03 NOTE — ED Provider Notes (Signed)
[facility]-EMERGENCY DEPT Provider Note   CSN: [id number] Arrival date & time: 02/03/20  1757     History Chief Complaint  Patient presents with  . Foot Pain  . Chest Pain    Lisa Hays is a 24 y.o. female who presents to the ED today with complaint of gradual onset, constant, achy, left plantar foot pain x 1 week. Pt reports when she walks the pain shoots up her leg. She reports worsening pain today prompting her to come to the ED. She also reports she had chest pain a few days ago but none currently. She reports she did not think much of the chest pain since it went away; she has had similar chest pain in the past but is unsure what the cause was. Per triage report pt started taking OCPs at the end of August however she reports to me she is on Depo. She states that since then she has intermittent pain mostly in the thigh of her LLE however when she began having the foot pain this concerned her. Pt has no other complaints. Denies any recent prolonged travel or immobilization. No hemoptysis. No active malignancy. No hx DVT/PE.   The history is provided by the patient and medical records.       History reviewed. No pertinent past medical history.  There are no problems to display for this patient.   History reviewed. No pertinent surgical history.   OB History   No obstetric history on file.     No family history on file.  Social History   Tobacco Use  . Smoking status: Not on file  Substance Use Topics  . Alcohol use: Not on file  . Drug use: Not on file    Home Medications Prior to Admission medications   Not on File    Allergies    Patient has no allergy information on record.  Review of Systems   Review of Systems  Constitutional: Negative for chills and fever.  Respiratory: Negative for cough and shortness of breath.   Cardiovascular: Positive for chest pain (resolved). Negative for leg swelling.  Musculoskeletal: Positive for  arthralgias.  All other systems reviewed and are negative.   Physical Exam Updated Vital Signs BP 115/82 (BP Location: Right Arm)   Pulse 99   Temp 98.6 F (37 C) (Oral)   Resp 18   Ht 5\' 2"  (1.575 m)   Wt 50.3 kg   SpO2 99%   BMI 20.30 kg/m   Physical Exam Vitals and nursing note reviewed.  Constitutional:      Appearance: She is not ill-appearing or diaphoretic.  HENT:     Head: Normocephalic and atraumatic.  Eyes:     Conjunctiva/sclera: Conjunctivae normal.  Cardiovascular:     Rate and Rhythm: Normal rate and regular rhythm.     Pulses:          Radial pulses are 2+ on the right side and 2+ on the left side.       Dorsalis pedis pulses are 2+ on the right side and 2+ on the left side.     Heart sounds: Normal heart sounds.  Pulmonary:     Effort: Pulmonary effort is normal.     Breath sounds: Normal breath sounds. No decreased breath sounds, wheezing, rhonchi or rales.  Chest:     Chest wall: No tenderness.  Abdominal:     Palpations: Abdomen is soft.     Tenderness: There is no abdominal tenderness.  Musculoskeletal:     Cervical back: Neck supple.     Right lower leg: No edema.     Left lower leg: No edema.     Comments: No edema noted to BLE. No TTP to the calves bilaterally. + TTP to the plantar aspect of the left foot. 2+ DP pulse. No tenderness proximally. ROM intact to hip, knee, and ankle of LLE. Strength and sensation intact.   Skin:    General: Skin is warm and dry.  Neurological:     Mental Status: She is alert.     ED Results / Procedures / Treatments   Labs (all labs ordered are listed, but only abnormal results are displayed) Labs Reviewed  PREGNANCY, URINE    EKG None  Radiology DG Chest 2 View  Result Date: 02/03/2020 CLINICAL DATA:  Intermittent chest pain. EXAM: CHEST - 2 VIEW COMPARISON:  None. FINDINGS: The heart size and mediastinal contours are within normal limits. Both lungs are clear. There is moderate severity  dextroscoliosis of the lower thoracic and upper lumbar spine. IMPRESSION: No active cardiopulmonary disease. Electronically Signed   By: Aram Candela M.D.   On: 02/03/2020 19:39   DG Foot Complete Left  Result Date: 02/03/2020 CLINICAL DATA:  Left foot pain. EXAM: LEFT FOOT - COMPLETE 3+ VIEW COMPARISON:  None. FINDINGS: There is no evidence of fracture or dislocation. There is no evidence of arthropathy or other focal bone abnormality. Soft tissues are unremarkable. IMPRESSION: Negative. Electronically Signed   By: Aram Candela M.D.   On: 02/03/2020 19:43   VAS [place] LOWER EXTREMITY VENOUS (DVT) (ONLY [facility] & [facility] 7a-7p)  Result Date: 02/03/2020  Lower Venous DVTStudy Indications: Pain.  Performing Technologist: Blanch Media RVS  Examination Guidelines: A complete evaluation includes B-mode imaging, spectral Doppler, color Doppler, and power Doppler as needed of all accessible portions of each vessel. Bilateral testing is considered an integral part of a complete examination. Limited examinations for reoccurring indications may be performed as noted. The reflux portion of the exam is performed with the patient in reverse Trendelenburg.  +-----+---------------+---------+-----------+----------+--------------+ RIGHTCompressibilityPhasicitySpontaneityPropertiesThrombus Aging +-----+---------------+---------+-----------+----------+--------------+ CFV  Full           Yes      Yes                                 +-----+---------------+---------+-----------+----------+--------------+   +---------+---------------+---------+-----------+----------+--------------+ LEFT     CompressibilityPhasicitySpontaneityPropertiesThrombus Aging +---------+---------------+---------+-----------+----------+--------------+ CFV      Full           Yes      Yes                                 +---------+---------------+---------+-----------+----------+--------------+ SFJ      Full                                                         +---------+---------------+---------+-----------+----------+--------------+ FV Prox  Full                                                        +---------+---------------+---------+-----------+----------+--------------+  FV Mid   Full                                                        +---------+---------------+---------+-----------+----------+--------------+ FV DistalFull                                                        +---------+---------------+---------+-----------+----------+--------------+ PFV      Full                                                        +---------+---------------+---------+-----------+----------+--------------+ POP      Full           Yes      Yes                                 +---------+---------------+---------+-----------+----------+--------------+ PTV      Full                                                        +---------+---------------+---------+-----------+----------+--------------+ PERO     Full                                                        +---------+---------------+---------+-----------+----------+--------------+     Summary: RIGHT: - No evidence of common femoral vein obstruction.  LEFT: - There is no evidence of deep vein thrombosis in the lower extremity.  - No cystic structure found in the popliteal fossa.  *See table(s) above for measurements and observations.    Preliminary     Procedures Procedures (including critical care time)  Medications Ordered in ED Medications - No data to display  ED Course  I have reviewed the triage vital signs and the nursing notes.  Pertinent labs & imaging results that were available during my care of the patient were reviewed by me and considered in my medical decision making (see chart for details).    MDM Rules/Calculators/A&P                          24 year old female who presents to the ED today mostly  complaining of left foot pain for the past week as well as an episode of chest pain couple of days ago.  During the triage process it appears patient stated that she was on oral contraception that she started in August however she is on the Depo shot.  She reports that since then she has had this intermittent pain in her left lower extremity however did not think much of it until she started having foot pain today.  No hx DVT/PE. No other risk factors today for blood clot however a DT study was ordered while pt was in triage - do not feel this is unreasonable today however I have lower suspicion for DVT; will await results.  Pt is an otherwise healthy appearing 24 year old. On arrival to the ED vitals are stable. Pt is afebrile, nontachycardic, and nontachypneic. Appears to be in NAD. She is not having any active chest pain and I doubt ACS causing pain several days ago however will obtain CXR for further evaluation. It appears her pain is mostly in her foot - plantar aspect with TTP. Will obtain xray at this time. Pt unable to state whether pain worsened during first steps in the morning or if the pain alleviates throughout the day as she has not been paying that close of attention to it however I am suspicious for plantar fasciitis today given aspect of pain.   Labwork including troponin was ordered by triage nurse; do not feel this is necessary today as I have very low suspicion for ACS causing pt's chest pain several days ago. Has been cancelled.  DVT negative  Xrays of foot and chest negative at this time. Will treat for plantar fasciitis and have pt follow up with PCP for her chest pain a few days ago. Strict return precautions have been discussed with pt including persistent chest pain, new shortness of breath, diaphoresis, passing out, or any other new/concerning sx. She is in agreement with plan and stable for discharge home.   This note was prepared using Dragon voice recognition software and may  include unintentional dictation errors due to the inherent limitations of voice recognition software.  Final Clinical Impression(s) / ED Diagnoses Final diagnoses:  Foot pain, left  Chest pain, unspecified type    Rx / DC Orders ED Discharge Orders    None       Discharge Instructions     Your imaging did not show any signs of blood clots in your left leg. Your chest xray and foot xray were negative without any abnormalities today.   Your foot pain may be from plantar fasciitis. See attached information regarding this. I would recommend taking Ibuprofen and Tylenol as needed for pain. You can roll your foot on a hard ball or a frozen water bottle to help stretch out the fascia. You can also buy a foot brace to wear at nighttime.   Please follow up with your PCP regarding your ED visit today. If you do not have one there is a [phone] number on the last page of this discharge paperwork to help find one who accepts your insurance.   Return to the ED for any worsening symptoms including persistent chest pain, shortness of breath, coughing up blood, passing out, or any other new/concerning symptoms.        Tanda Rockers, PA-C 02/03/20 2006    Geoffery Lyons, MD 02/04/20 1622

## 2020-02-03 NOTE — Discharge Instructions (Signed)
Your imaging did not show any signs of blood clots in your left leg. Your chest xray and foot xray were negative without any abnormalities today.   Your foot pain may be from plantar fasciitis. See attached information regarding this. I would recommend taking Ibuprofen and Tylenol as needed for pain. You can roll your foot on a hard ball or a frozen water bottle to help stretch out the fascia. You can also buy a foot brace to wear at nighttime.   Please follow up with your PCP regarding your ED visit today. If you do not have one there is a [phone] number on the last page of this discharge paperwork to help find one who accepts your insurance.   Return to the ED for any worsening symptoms including persistent chest pain, shortness of breath, coughing up blood, passing out, or any other new/concerning symptoms.

## 2020-02-03 NOTE — ED Triage Notes (Signed)
Pt reports left foot pain that began about a week and a half ago. She also reports intermittent chest pain that began at the same time. Pt states pain sometimes shoots up her leg from her foot when laying down. Pt started taking oral contraceptives at the end of August.

## 2020-02-03 NOTE — ED Notes (Signed)
Vascular at bedside

## 2021-12-10 IMAGING — CR DG FOOT COMPLETE 3+V*L*
3 series · 3 of 3 positions shown · non-contrast
Comparison: None.

CLINICAL DATA: Left foot pain.

EXAM:
LEFT FOOT - COMPLETE 3+ VIEW

[x foot ap left]
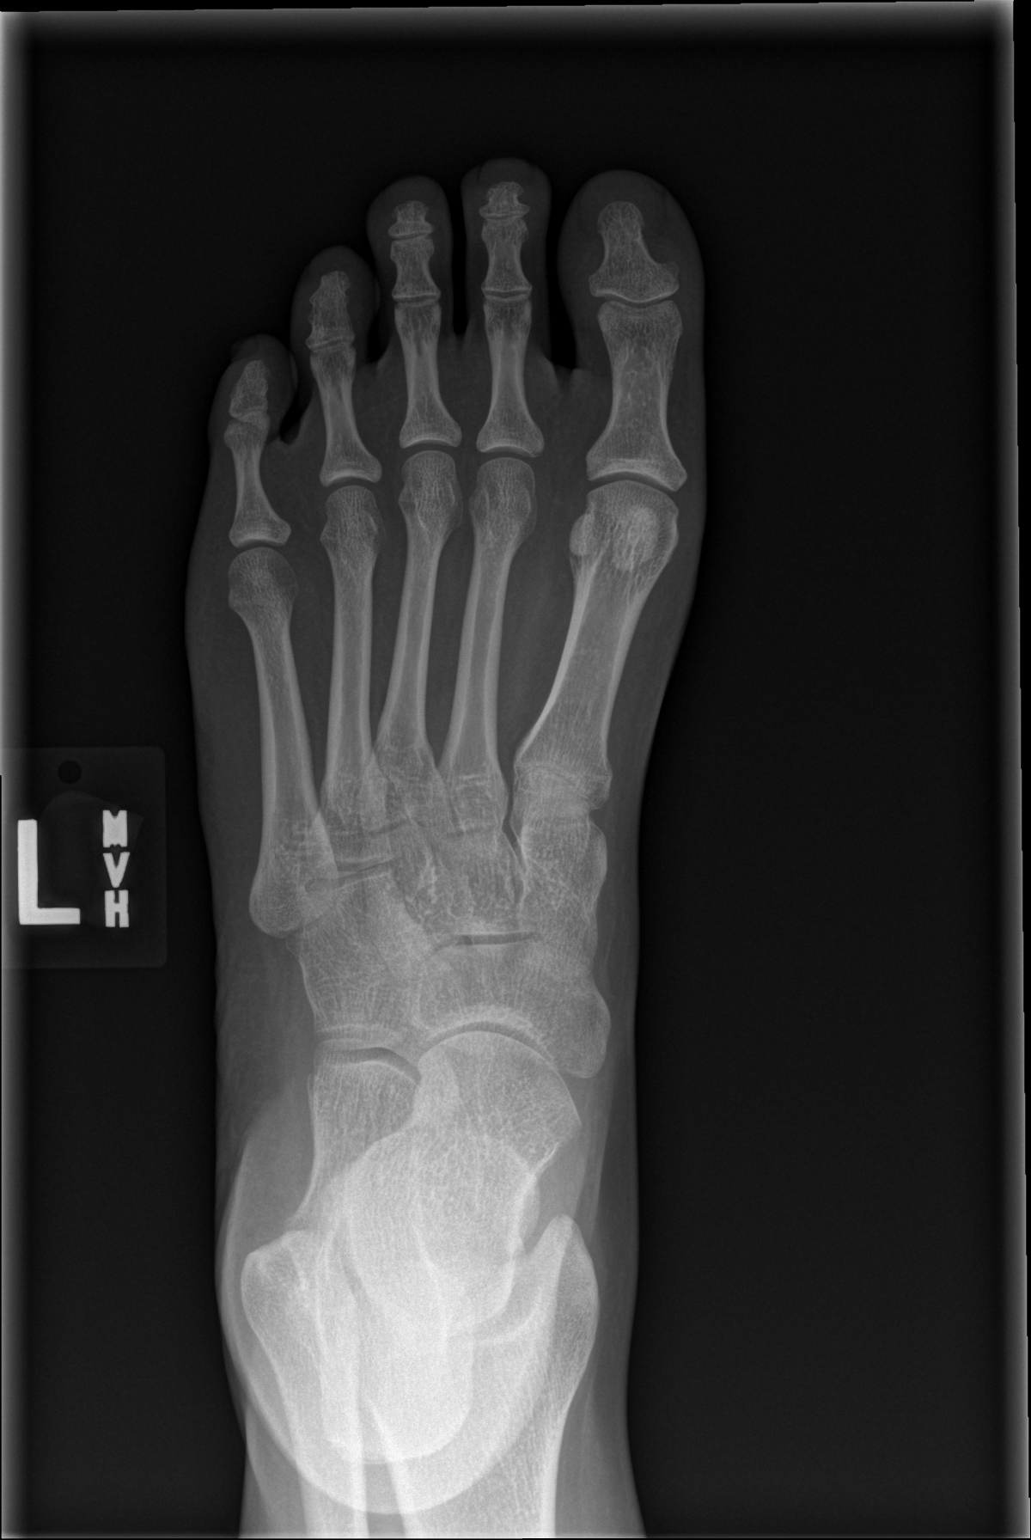

[x foot obl left]
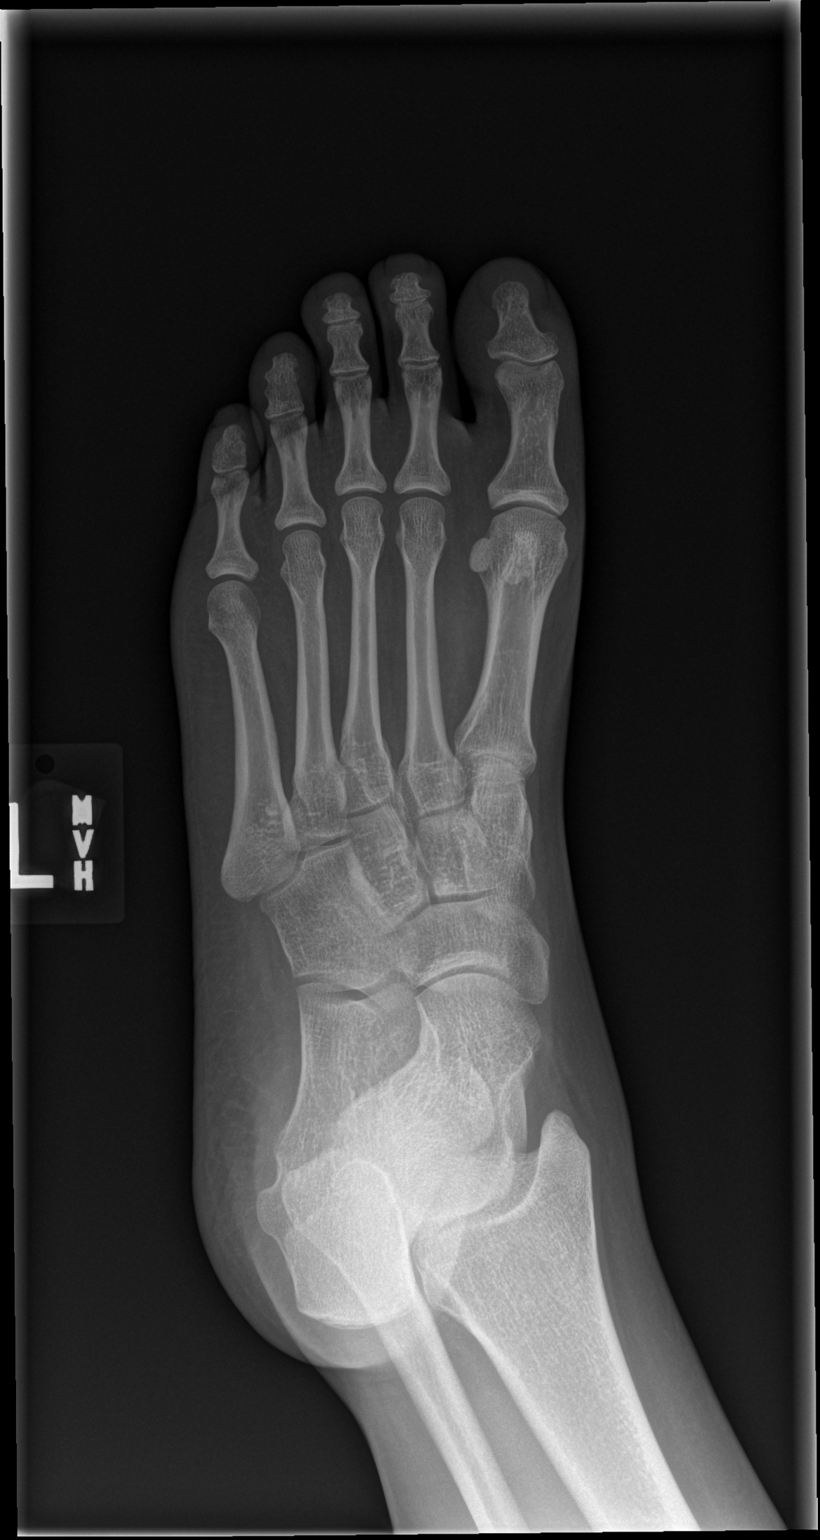

[x foot lat left]
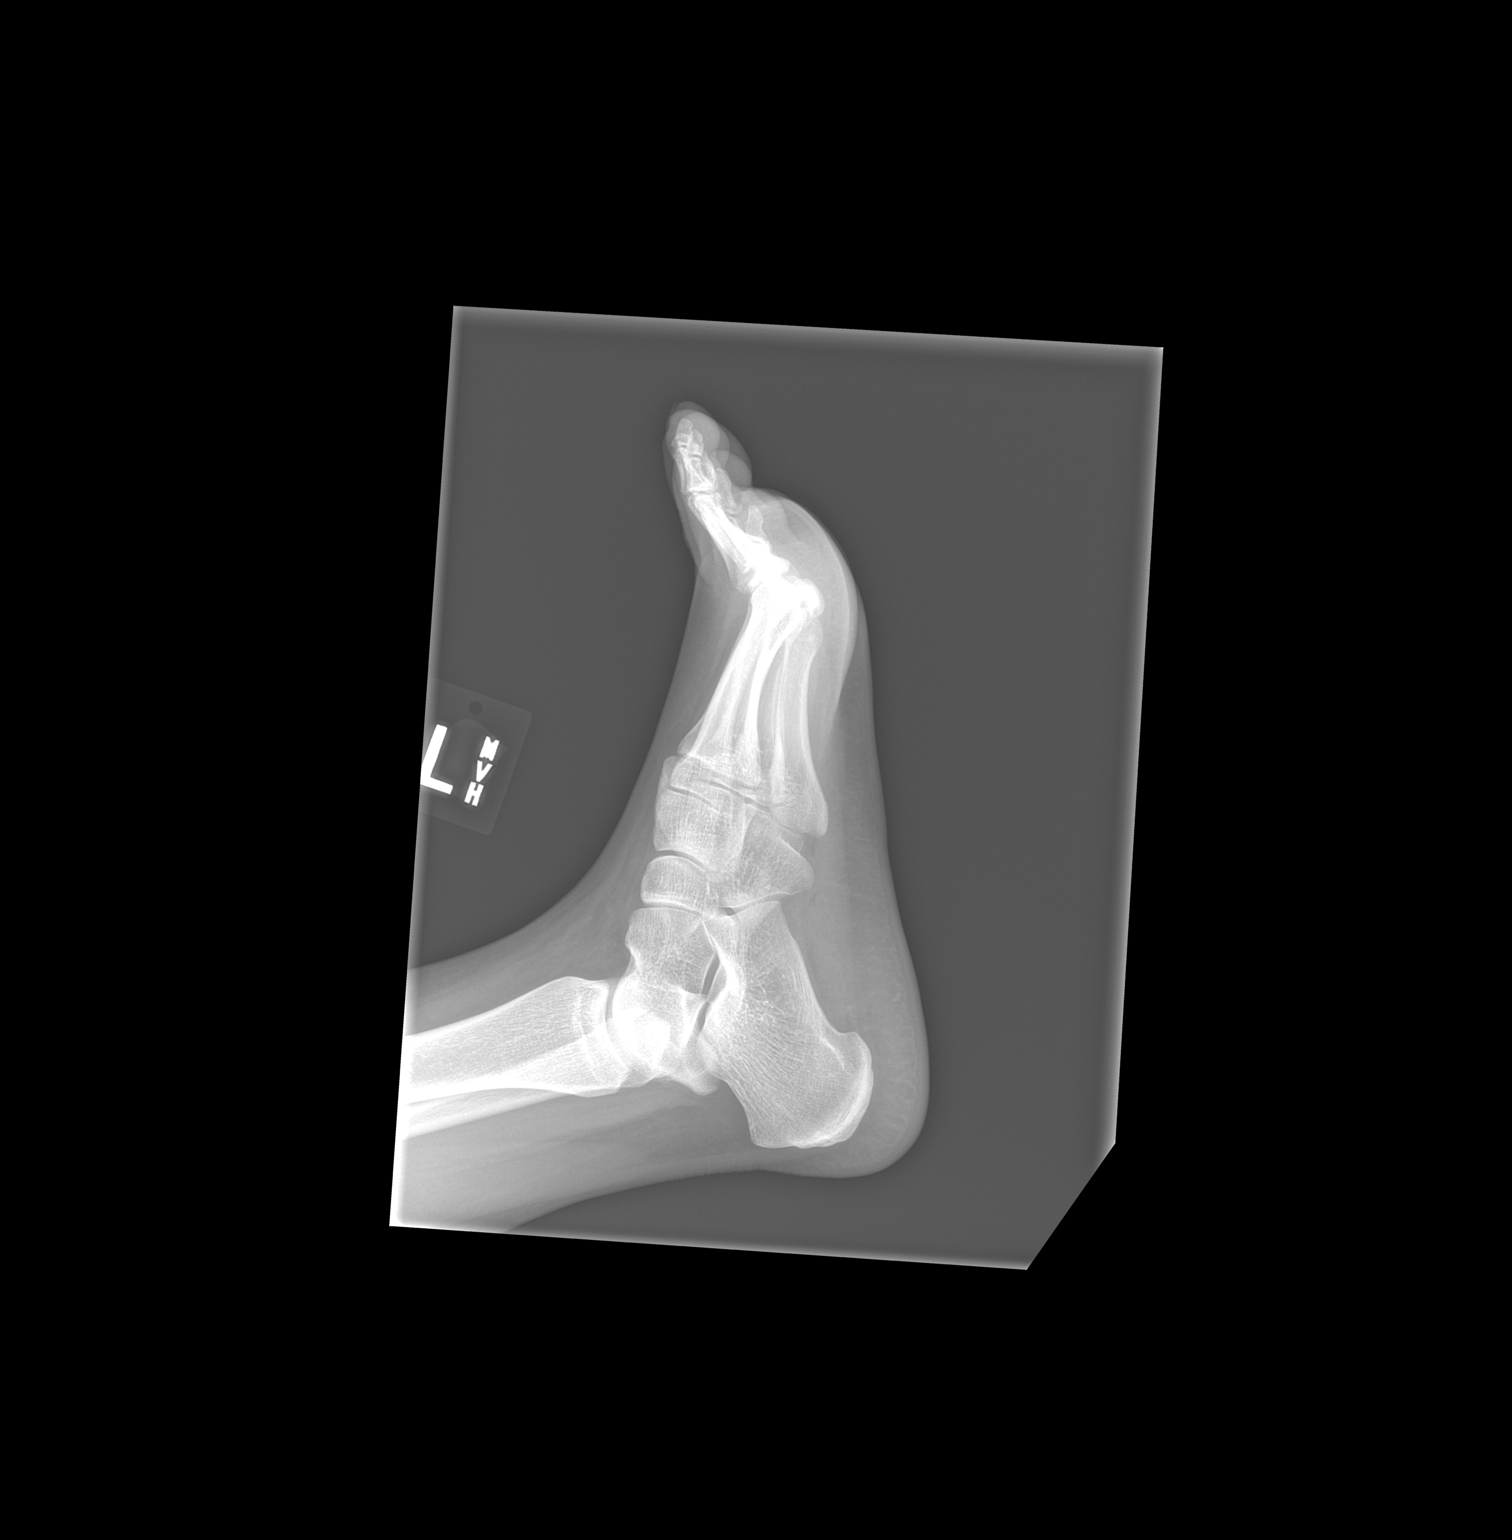

[3 of 3 positions shown; findings below may reference images not displayed]

FINDINGS: There is no evidence of fracture or dislocation. There is no
evidence of arthropathy or other focal bone abnormality. Soft
tissues are unremarkable.
IMPRESSION: Negative.
